# Patient Record
Sex: Male | Born: 2005 | Race: White | Hispanic: No | Marital: Single | Smoking: Never smoker
Health system: Southern US, Community
[De-identification: ages and names within clinical notes are randomized; demographics above are authoritative.]

---
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
## 2024-04-30 DIAGNOSIS — M25561 Pain in right knee: Secondary | ICD-10-CM | POA: Diagnosis not present

## 2024-04-30 DIAGNOSIS — M25522 Pain in left elbow: Secondary | ICD-10-CM | POA: Diagnosis not present

## 2024-04-30 DIAGNOSIS — S53442A Ulnar collateral ligament sprain of left elbow, initial encounter: Secondary | ICD-10-CM | POA: Diagnosis not present

## 2024-05-12 DIAGNOSIS — M25522 Pain in left elbow: Secondary | ICD-10-CM | POA: Diagnosis not present

## 2024-05-12 DIAGNOSIS — M6281 Muscle weakness (generalized): Secondary | ICD-10-CM | POA: Diagnosis not present

## 2024-05-12 DIAGNOSIS — M25561 Pain in right knee: Secondary | ICD-10-CM | POA: Diagnosis not present

## 2024-05-14 DIAGNOSIS — M25522 Pain in left elbow: Secondary | ICD-10-CM | POA: Diagnosis not present

## 2024-05-14 DIAGNOSIS — M25561 Pain in right knee: Secondary | ICD-10-CM | POA: Diagnosis not present

## 2024-05-14 DIAGNOSIS — M6281 Muscle weakness (generalized): Secondary | ICD-10-CM | POA: Diagnosis not present

## 2024-05-19 DIAGNOSIS — M25522 Pain in left elbow: Secondary | ICD-10-CM | POA: Diagnosis not present

## 2024-05-19 DIAGNOSIS — M25561 Pain in right knee: Secondary | ICD-10-CM | POA: Diagnosis not present

## 2024-05-19 DIAGNOSIS — M6281 Muscle weakness (generalized): Secondary | ICD-10-CM | POA: Diagnosis not present

## 2024-05-22 DIAGNOSIS — M25522 Pain in left elbow: Secondary | ICD-10-CM | POA: Diagnosis not present

## 2024-05-22 DIAGNOSIS — M25561 Pain in right knee: Secondary | ICD-10-CM | POA: Diagnosis not present

## 2024-05-22 DIAGNOSIS — M6281 Muscle weakness (generalized): Secondary | ICD-10-CM | POA: Diagnosis not present

## 2024-07-16 DIAGNOSIS — Z0001 Encounter for general adult medical examination with abnormal findings: Secondary | ICD-10-CM | POA: Diagnosis not present

## 2024-07-16 DIAGNOSIS — R61 Generalized hyperhidrosis: Secondary | ICD-10-CM | POA: Diagnosis not present

## 2024-07-16 DIAGNOSIS — Z13220 Encounter for screening for lipoid disorders: Secondary | ICD-10-CM | POA: Diagnosis not present

## 2024-07-16 DIAGNOSIS — Z131 Encounter for screening for diabetes mellitus: Secondary | ICD-10-CM | POA: Diagnosis not present

## 2024-07-16 DIAGNOSIS — F439 Reaction to severe stress, unspecified: Secondary | ICD-10-CM | POA: Diagnosis not present
